# Patient Record
Sex: Male | Born: 1970 | Race: White | Hispanic: No
Health system: Southern US, Community
[De-identification: ages and names within clinical notes are randomized; demographics above are authoritative.]

---

## 2020-02-21 ENCOUNTER — Emergency Department (HOSPITAL_COMMUNITY): Payer: Self-pay

## 2020-02-21 ENCOUNTER — Emergency Department (HOSPITAL_COMMUNITY)
Admission: EM | Admit: 2020-02-21 | Discharge: 2020-02-22 | Disposition: A | Discharge status: Home / Self Care | Payer: Self-pay | Attending: Emergency Medicine | Admitting: Emergency Medicine

## 2020-02-21 ENCOUNTER — Other Ambulatory Visit: Payer: Self-pay

## 2020-02-21 DIAGNOSIS — M542 Cervicalgia: Secondary | ICD-10-CM | POA: Insufficient documentation

## 2020-02-21 DIAGNOSIS — Z5321 Procedure and treatment not carried out due to patient leaving prior to being seen by health care provider: Secondary | ICD-10-CM | POA: Insufficient documentation

## 2020-02-21 DIAGNOSIS — M549 Dorsalgia, unspecified: Secondary | ICD-10-CM | POA: Insufficient documentation
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
